# Patient Record
Sex: Male | Born: 1980 | Race: White | Hispanic: No | Marital: Married | Smoking: Never smoker
Health system: Southern US, Community
[De-identification: ages and names within clinical notes are randomized; demographics above are authoritative.]

## PROBLEM LIST (undated history)

## (undated) DIAGNOSIS — T7840XA Allergy, unspecified, initial encounter: Secondary | ICD-10-CM

## (undated) HISTORY — DX: Allergy, unspecified, initial encounter: T78.40XA

## (undated) HISTORY — PX: WISDOM TOOTH EXTRACTION: SHX21

---

## 2015-05-03 ENCOUNTER — Encounter: Payer: Self-pay | Admitting: Family Medicine

## 2015-05-03 ENCOUNTER — Ambulatory Visit (INDEPENDENT_AMBULATORY_CARE_PROVIDER_SITE_OTHER): Payer: BLUE CROSS/BLUE SHIELD | Admitting: Family Medicine

## 2015-05-03 VITALS — BP 118/70 | HR 74 | Temp 98.0°F | Ht 65.0 in | Wt 184.8 lb

## 2015-05-03 DIAGNOSIS — L500 Allergic urticaria: Secondary | ICD-10-CM | POA: Insufficient documentation

## 2015-05-03 DIAGNOSIS — E66811 Obesity, class 1: Secondary | ICD-10-CM | POA: Insufficient documentation

## 2015-05-03 DIAGNOSIS — E669 Obesity, unspecified: Secondary | ICD-10-CM

## 2015-05-03 DIAGNOSIS — Z0000 Encounter for general adult medical examination without abnormal findings: Secondary | ICD-10-CM

## 2015-05-03 LAB — LIPID PANEL
Cholesterol: 133 mg/dL (ref 0–200)
HDL: 33.2 mg/dL — AB (ref >39.00)
LDL CALC: 86 mg/dL (ref 0–99)
NONHDL: 100.1
Total CHOL/HDL Ratio: 4
Triglycerides: 71 mg/dL (ref 0.0–149.0)
VLDL: 14.2 mg/dL (ref 0.0–40.0)

## 2015-05-03 LAB — COMPREHENSIVE METABOLIC PANEL
ALK PHOS: 60 U/L (ref 39–117)
ALT: 27 U/L (ref 0–53)
AST: 17 U/L (ref 0–37)
Albumin: 4.1 g/dL (ref 3.5–5.2)
BILIRUBIN TOTAL: 0.5 mg/dL (ref 0.2–1.2)
BUN: 14 mg/dL (ref 6–23)
CALCIUM: 8.9 mg/dL (ref 8.4–10.5)
CO2: 30 meq/L (ref 19–32)
Chloride: 106 mEq/L (ref 96–112)
Creatinine, Ser: 1.11 mg/dL (ref 0.40–1.50)
GFR: 79.56 mL/min (ref >60.00)
Glucose, Bld: 97 mg/dL (ref 70–99)
POTASSIUM: 4.6 meq/L (ref 3.5–5.1)
Sodium: 142 mEq/L (ref 135–145)
TOTAL PROTEIN: 6.6 g/dL (ref 6.0–8.3)

## 2015-05-03 LAB — TSH: TSH: 1.57 u[IU]/mL (ref 0.35–4.50)

## 2015-05-03 NOTE — Patient Instructions (Addendum)
Bring me copy of your immunization records to review (to see if you've had Tdap). labwork today. Return as needed or in 1-2 years for next physical.  Health Maintenance A healthy lifestyle and preventative care can promote health and wellness.  Maintain regular health, dental, and eye exams.  Eat a healthy diet. Foods like vegetables, fruits, whole grains, low-fat dairy products, and lean protein foods contain the nutrients you need and are low in calories. Decrease your intake of foods high in solid fats, added sugars, and salt. Get information about a proper diet from your health care provider, if necessary.  Regular physical exercise is one of the most important things you can do for your health. Most adults should get at least 150 minutes of moderate-intensity exercise (any activity that increases your heart rate and causes you to sweat) each week. In addition, most adults need muscle-strengthening exercises on 2 or more days a week.   Maintain a healthy weight. The body mass index (BMI) is a screening tool to identify possible weight problems. It provides an estimate of body fat based on height and weight. Your health care provider can find your BMI and can help you achieve or maintain a healthy weight. For males 20 years and older:  A BMI below 18.5 is considered underweight.  A BMI of 18.5 to 24.9 is normal.  A BMI of 25 to 29.9 is considered overweight.  A BMI of 30 and above is considered obese.  Maintain normal blood lipids and cholesterol by exercising and minimizing your intake of saturated fat. Eat a balanced diet with plenty of fruits and vegetables. Blood tests for lipids and cholesterol should begin at age 75 and be repeated every 5 years. If your lipid or cholesterol levels are high, you are over age 83, or you are at high risk for heart disease, you may need your cholesterol levels checked more frequently.Ongoing high lipid and cholesterol levels should be treated with  medicines if diet and exercise are not working.  If you smoke, find out from your health care provider how to quit. If you do not use tobacco, do not start.  Lung cancer screening is recommended for adults aged 55-80 years who are at high risk for developing lung cancer because of a history of smoking. A yearly low-dose CT scan of the lungs is recommended for people who have at least a 30-pack-year history of smoking and are current smokers or have quit within the past 15 years. A pack year of smoking is smoking an average of 1 pack of cigarettes a day for 1 year (for example, a 30-pack-year history of smoking could mean smoking 1 pack a day for 30 years or 2 packs a day for 15 years). Yearly screening should continue until the smoker has stopped smoking for at least 15 years. Yearly screening should be stopped for people who develop a health problem that would prevent them from having lung cancer treatment.  If you choose to drink alcohol, do not have more than 2 drinks per day. One drink is considered to be 12 oz (360 mL) of beer, 5 oz (150 mL) of wine, or 1.5 oz (45 mL) of liquor.  Avoid the use of street drugs. Do not share needles with anyone. Ask for help if you need support or instructions about stopping the use of drugs.  High blood pressure causes heart disease and increases the risk of stroke. Blood pressure should be checked at least every 1-2 years. Ongoing high  blood pressure should be treated with medicines if weight loss and exercise are not effective.  If you are 51-72 years old, ask your health care provider if you should take aspirin to prevent heart disease.  Diabetes screening involves taking a blood sample to check your fasting blood sugar level. This should be done once every 3 years after age 26 if you are at a normal weight and without risk factors for diabetes. Testing should be considered at a younger age or be carried out more frequently if you are overweight and have at  least 1 risk factor for diabetes.  Colorectal cancer can be detected and often prevented. Most routine colorectal cancer screening begins at the age of 24 and continues through age 55. However, your health care provider may recommend screening at an earlier age if you have risk factors for colon cancer. On a yearly basis, your health care provider may provide home test kits to check for hidden blood in the stool. A small camera at the end of a tube may be used to directly examine the colon (sigmoidoscopy or colonoscopy) to detect the earliest forms of colorectal cancer. Talk to your health care provider about this at age 37 when routine screening begins. A direct exam of the colon should be repeated every 5-10 years through age 90, unless early forms of precancerous polyps or small growths are found.  People who are at an increased risk for hepatitis B should be screened for this virus. You are considered at high risk for hepatitis B if:  You were born in a country where hepatitis B occurs often. Talk with your health care provider about which countries are considered high risk.  Your parents were born in a high-risk country and you have not received a shot to protect against hepatitis B (hepatitis B vaccine).  You have HIV or AIDS.  You use needles to inject street drugs.  You live with, or have sex with, someone who has hepatitis B.  You are a man who has sex with other men (MSM).  You get hemodialysis treatment.  You take certain medicines for conditions like cancer, organ transplantation, and autoimmune conditions.  Hepatitis C blood testing is recommended for all people born from [birth year] through 1967 and any individual with known risk factors for hepatitis C.  Healthy men should no longer receive prostate-specific antigen (PSA) blood tests as part of routine cancer screening. Talk to your health care provider about prostate cancer screening.  Testicular cancer screening is not recommended  for adolescents or adult males who have no symptoms. Screening includes self-exam, a health care provider exam, and other screening tests. Consult with your health care provider about any symptoms you have or any concerns you have about testicular cancer.  Practice safe sex. Use condoms and avoid high-risk sexual practices to reduce the spread of sexually transmitted infections (STIs).  You should be screened for STIs, including gonorrhea and chlamydia if:  You are sexually active and are younger than 24 years.  You are older than 24 years, and your health care provider tells you that you are at risk for this type of infection.  Your sexual activity has changed since you were last screened, and you are at an increased risk for chlamydia or gonorrhea. Ask your health care provider if you are at risk.  If you are at risk of being infected with HIV, it is recommended that you take a prescription medicine daily to prevent HIV infection. This is called  pre-exposure prophylaxis (PrEP). You are considered at risk if:  You are a man who has sex with other men (MSM).  You are a heterosexual man who is sexually active with multiple partners.  You take drugs by injection.  You are sexually active with a partner who has HIV.  Talk with your health care provider about whether you are at high risk of being infected with HIV. If you choose to begin PrEP, you should first be tested for HIV. You should then be tested every 3 months for as long as you are taking PrEP.  Use sunscreen. Apply sunscreen liberally and repeatedly throughout the day. You should seek shade when your shadow is shorter than you. Protect yourself by wearing long sleeves, pants, a wide-brimmed hat, and sunglasses year round whenever you are outdoors.  Tell your health care provider of new moles or changes in moles, especially if there is a change in shape or color. Also, tell your health care provider if a mole is larger than the size  of a pencil eraser.  A one-time screening for abdominal aortic aneurysm (AAA) and surgical repair of large AAAs by ultrasound is recommended for men aged 60-75 years who are current or former smokers.  Stay current with your vaccines (immunizations). Document Released: 02/13/2008 Document Revised: 08/22/2013 Document Reviewed: 01/12/2011 [facility] Patient Information 2015 [place], [place]. This information is not intended to replace advice given to you by your health care provider. Make sure you discuss any questions you have with your health care provider.

## 2015-05-03 NOTE — Progress Notes (Signed)
Pre visit review using our clinic review tool, if applicable. No additional management support is needed unless otherwise documented below in the visit note. 

## 2015-05-03 NOTE — Assessment & Plan Note (Signed)
Encouraged incorporating regular exercise regimen in routine. Body mass index is 30.74 kg/(m^2).

## 2015-05-03 NOTE — Progress Notes (Signed)
BP 118/70 mmHg  Pulse 74  Temp(Src) 98 F (36.7 C) (Oral)  Ht 5' 5"  (1.651 m)  Wt 184 lb 12 oz (83.802 kg)  BMI 30.74 kg/m2  SpO2 96%   CC: new pt to establish care  Subjective:    Patient ID: Martin Bryant, male    DOB: [birth year], 34 y.o.   MRN: [id number]  HPI: Martin Bryant is a 34 y.o. male presenting on 05/03/2015 for Establish Care and Pruritis   Allergic rash described as urticarial/hive rash that seems to develop around exposure to something at work. Sterile gowning at work, works with varicella and MMR vaccine. Thinks has latex free protective equipment. Zyrtec takes care of this. Less reactions out of work. Works third shift. Rash seems to develop on drive home. Has not found specific foods to trigger this. Doesn't think temperature related.  No fevers, throat closing, tongue or lip swelling, nausea, dyspnea.   Preventative: Not fasting today Flu shot - yearly at work Td - declines today Seat belt use discussed Sunscreen use discussed, no changing moles on skin.  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 Interim medical history since our last visit reviewed. Allergies and medications reviewed and updated. No current outpatient prescriptions on file prior to visit.   No current facility-administered medications on file prior to visit.    Review of Systems  Constitutional: Negative for fever, chills, activity change, appetite change, fatigue and unexpected weight change.  HENT: Negative for hearing loss.   Eyes: Negative for visual disturbance.  Respiratory: Negative for cough, chest tightness, shortness of breath and wheezing.   Cardiovascular: Negative for  chest pain, palpitations and leg swelling.  Gastrointestinal: Negative for nausea, vomiting, abdominal pain, diarrhea, constipation, blood in stool and abdominal distention.  Genitourinary: Negative for hematuria and difficulty urinating.  Musculoskeletal: Negative for myalgias, arthralgias and neck pain.  Skin: Negative for rash.  Neurological: Negative for dizziness, seizures, syncope and headaches.  Hematological: Negative for adenopathy. Does not bruise/bleed easily.  Psychiatric/Behavioral: Negative for dysphoric mood. The patient is not nervous/anxious.    Per HPI unless specifically indicated above     Objective:    BP 118/70 mmHg  Pulse 74  Temp(Src) 98 F (36.7 C) (Oral)  Ht 5' 5"  (1.651 m)  Wt 184 lb 12 oz (83.802 kg)  BMI 30.74 kg/m2  SpO2 96%  Wt Readings from Last 3 Encounters:  05/03/15 184 lb 12 oz (83.802 kg)    Physical Exam  Constitutional: He is oriented to person, place, and time. He appears well-developed and well-nourished. No distress.  HENT:  Head: Normocephalic and atraumatic.  Right Ear: Hearing, tympanic membrane, external ear and ear canal normal.  Left Ear: Hearing, tympanic membrane, external ear and ear canal normal.  Nose: Nose normal.  Mouth/Throat: Uvula is midline, oropharynx is clear and moist and mucous membranes are normal. No oropharyngeal exudate, posterior oropharyngeal edema or posterior oropharyngeal erythema.  Eyes: Conjunctivae and EOM are normal. Pupils are equal, round, and reactive to light. No scleral icterus.  Neck: Normal range of motion. Neck supple. No thyromegaly present.  Cardiovascular: Normal rate, regular rhythm, normal heart sounds and intact distal pulses.   No murmur heard. Pulses:  Radial pulses are 2+ on the right side, and 2+ on the left side.  Pulmonary/Chest: Effort normal and breath sounds normal. No respiratory distress. He has no wheezes. He has no rales.  Abdominal: Soft. Bowel sounds are normal. He  exhibits no distension and no mass. There is no tenderness. There is no rebound and no guarding.  Musculoskeletal: Normal range of motion. He exhibits no edema.  Lymphadenopathy:    He has no cervical adenopathy.  Neurological: He is alert and oriented to person, place, and time.  CN grossly intact, station and gait intact  Skin: Skin is warm and dry. No rash noted.  Psychiatric: He has a normal mood and affect. His behavior is normal. Judgment and thought content normal.  Nursing note and vitals reviewed.  No results found for this or any previous visit.    Assessment & Plan:   Problem List Items Addressed This Visit    Obesity, Class I, BMI 30-34.9    Encouraged incorporating regular exercise regimen in routine. Body mass index is 30.74 kg/(m^2).       Relevant Orders   Lipid panel   TSH   Comprehensive metabolic panel   Health maintenance examination - Primary    Preventative protocols reviewed and updated unless pt declined. Discussed healthy diet and lifestyle.       Allergic urticaria    Have not found trigger for this. No systemic sxs - limited to skin manifestations. Reassured. Continue to monitor, work on finding trigger. If worsening, consider allergy referral. Continue zyrtec PRN.  Discussed different types of urticarial reactions (med, food allergies, temperature or stress urticaria, etc)          Follow up plan: Return in about 1 year (around 05/02/2016), or as needed, for annual exam, prior fasting for blood work.

## 2015-05-03 NOTE — Assessment & Plan Note (Addendum)
Have not found trigger for this. No systemic sxs - limited to skin manifestations. Reassured. Continue to monitor, work on finding trigger. If worsening, consider allergy referral. Continue zyrtec PRN.  Discussed different types of urticarial reactions (med, food allergies, temperature or stress urticaria, etc)

## 2015-05-03 NOTE — Assessment & Plan Note (Signed)
Preventative protocols reviewed and updated unless pt declined. Discussed healthy diet and lifestyle.  

## 2015-05-07 ENCOUNTER — Encounter: Payer: Self-pay | Admitting: *Deleted

## 2015-06-24 ENCOUNTER — Other Ambulatory Visit: Payer: Self-pay | Admitting: Internal Medicine

## 2015-06-24 MED ORDER — TRIAMCINOLONE ACETONIDE 0.5 % EX OINT: 1.0000 "application " | TOPICAL_OINTMENT | Freq: Two times a day (BID) | CUTANEOUS | Status: AC

## 2015-12-12 ENCOUNTER — Encounter: Payer: Self-pay | Admitting: Emergency Medicine

## 2015-12-12 DIAGNOSIS — R55 Syncope and collapse: Secondary | ICD-10-CM | POA: Insufficient documentation

## 2015-12-12 DIAGNOSIS — R1011 Right upper quadrant pain: Secondary | ICD-10-CM | POA: Diagnosis not present

## 2015-12-12 LAB — CBC
HCT: 44.8 % (ref 40.0–52.0)
Hemoglobin: 15.6 g/dL (ref 13.0–18.0)
MCH: 31.9 pg (ref 26.0–34.0)
MCHC: 34.9 g/dL (ref 32.0–36.0)
MCV: 91.5 fL (ref 80.0–100.0)
PLATELETS: 236 10*3/uL (ref 150–440)
RBC: 4.9 MIL/uL (ref 4.40–5.90)
RDW: 12.6 % (ref 11.5–14.5)
WBC: 9.4 10*3/uL (ref 3.8–10.6)

## 2015-12-12 LAB — COMPREHENSIVE METABOLIC PANEL
ALBUMIN: 4.1 g/dL (ref 3.5–5.0)
ALK PHOS: 55 U/L (ref 38–126)
ALT: 24 U/L (ref 17–63)
ANION GAP: 5 (ref 5–15)
AST: 22 U/L (ref 15–41)
BILIRUBIN TOTAL: 0.9 mg/dL (ref 0.3–1.2)
BUN: 23 mg/dL — AB (ref 6–20)
CALCIUM: 8.6 mg/dL — AB (ref 8.9–10.3)
CO2: 24 mmol/L (ref 22–32)
CREATININE: 0.99 mg/dL (ref 0.61–1.24)
Chloride: 106 mmol/L (ref 101–111)
GFR calc Af Amer: >60 mL/min (ref >60)
GFR calc non Af Amer: >60 mL/min (ref >60)
GLUCOSE: 138 mg/dL — AB (ref 65–99)
Potassium: 4 mmol/L (ref 3.5–5.1)
Sodium: 135 mmol/L (ref 135–145)
TOTAL PROTEIN: 6.8 g/dL (ref 6.5–8.1)

## 2015-12-12 LAB — LIPASE, BLOOD: Lipase: 20 U/L (ref 11–51)

## 2015-12-12 LAB — TROPONIN I

## 2015-12-12 NOTE — ED Notes (Signed)
Pt arrived to triage by EMS with c/o right sided upper abdominal quadrant pain. Pt states pain is mostly under right ribs, has shortness of breath and had a syncopal episode at home. Pt denies pain since syncopal episode.

## 2015-12-13 ENCOUNTER — Emergency Department

## 2015-12-13 ENCOUNTER — Emergency Department
Admission: EM | Admit: 2015-12-13 | Discharge: 2015-12-13 | Disposition: A | Discharge status: Home / Self Care | Attending: Emergency Medicine | Admitting: Emergency Medicine

## 2015-12-13 DIAGNOSIS — R1011 Right upper quadrant pain: Secondary | ICD-10-CM

## 2015-12-13 DIAGNOSIS — R55 Syncope and collapse: Secondary | ICD-10-CM

## 2015-12-13 LAB — URINALYSIS COMPLETE WITH MICROSCOPIC (ARMC ONLY)
BILIRUBIN URINE: NEGATIVE
Glucose, UA: NEGATIVE mg/dL
Hgb urine dipstick: NEGATIVE
Ketones, ur: NEGATIVE mg/dL
Leukocytes, UA: NEGATIVE
Nitrite: NEGATIVE
PROTEIN: NEGATIVE mg/dL
Specific Gravity, Urine: 1.024 (ref 1.005–1.030)
pH: 5 (ref 5.0–8.0)

## 2015-12-13 LAB — FIBRIN DERIVATIVES D-DIMER (ARMC ONLY): FIBRIN DERIVATIVES D-DIMER (ARMC): 109 (ref 0–499)

## 2015-12-13 MED ORDER — IOPAMIDOL (ISOVUE-300) INJECTION 61%
100.0000 mL | Freq: Once | INTRAVENOUS | Status: AC | PRN
  Administered 2015-12-13: 100 mL via INTRAVENOUS

## 2015-12-13 MED ORDER — DIATRIZOATE MEGLUMINE & SODIUM 66-10 % PO SOLN
15.0000 mL | Freq: Once | ORAL | Status: AC
  Administered 2015-12-13: 15 mL via ORAL
  Filled 2015-12-13: qty 30

## 2015-12-13 NOTE — ED Notes (Signed)
Brown, MD at bedside. 

## 2015-12-13 NOTE — Discharge Instructions (Signed)
Syncope °Syncope is a medical term for fainting or passing out. This means you lose consciousness and drop to the ground. People are generally unconscious for less than 5 minutes. You may have some muscle twitches for up to 15 seconds before waking up and returning to normal. Syncope occurs more often in older adults, but it can happen to anyone. While most causes of syncope are not dangerous, syncope can be a sign of a serious medical problem. It is important to seek medical care.  °CAUSES  °Syncope is caused by a sudden drop in blood flow to the brain. The specific cause is often not determined. Factors that can bring on syncope include: °· Taking medicines that lower blood pressure. °· Sudden changes in posture, such as standing up quickly. °· Taking more medicine than prescribed. °· Standing in one place for too long. °· Seizure disorders. °· Dehydration and excessive exposure to heat. °· Low blood sugar (hypoglycemia). °· Straining to have a bowel movement. °· Heart disease, irregular heartbeat, or other circulatory problems. °· Fear, emotional distress, seeing blood, or severe pain. °SYMPTOMS  °Right before fainting, you may: °· Feel dizzy or light-headed. °· Feel nauseous. °· See all white or all black in your field of vision. °· Have cold, clammy skin. °DIAGNOSIS  °Your health care provider will ask about your symptoms, perform a physical exam, and perform an electrocardiogram (ECG) to record the electrical activity of your heart. Your health care provider may also perform other heart or blood tests to determine the cause of your syncope which may include: °· Transthoracic echocardiogram (TTE). During echocardiography, sound waves are used to evaluate how blood flows through your heart. °· Transesophageal echocardiogram (TEE). °· Cardiac monitoring. This allows your health care provider to monitor your heart rate and rhythm in real time. °· Holter monitor. This is a portable device that records your  heartbeat and can help diagnose heart arrhythmias. It allows your health care provider to track your heart activity for several days, if needed. °· Stress tests by exercise or by giving medicine that makes the heart beat faster. °TREATMENT  °In most cases, no treatment is needed. Depending on the cause of your syncope, your health care provider may recommend changing or stopping some of your medicines. °HOME CARE INSTRUCTIONS °· Have someone stay with you until you feel stable. °· Do not drive, use machinery, or play sports until your health care provider says it is okay. °· Keep all follow-up appointments as directed by your health care provider. °· Lie down right away if you start feeling like you might faint. Breathe deeply and steadily. Wait until all the symptoms have passed. °· Drink enough fluids to keep your urine clear or pale yellow. °· If you are taking blood pressure or heart medicine, get up slowly and take several minutes to sit and then stand. This can reduce dizziness. °SEEK IMMEDIATE MEDICAL CARE IF:  °· You have a severe headache. °· You have unusual pain in the chest, abdomen, or back. °· You are bleeding from your mouth or rectum, or you have black or tarry stool. °· You have an irregular or very fast heartbeat. °· You have pain with breathing. °· You have repeated fainting or seizure-like jerking during an episode. °· You faint when sitting or lying down. °· You have confusion. °· You have trouble walking. °· You have severe weakness. °· You have vision problems. °If you fainted, call your local emergency services (911 in [place] Do not drive   yourself to the hospital.  °  °This information is not intended to replace advice given to you by your health care provider. Make sure you discuss any questions you have with your health care provider. °  °Document Released: 08/17/2005 Document Revised: 01/01/2015 Document Reviewed: 10/16/2011 °Elsevier Interactive Patient Education ©2016 [employer] ° °Abdominal Pain, Adult °Many things can cause abdominal pain. Usually, abdominal pain is not caused by a disease and will improve without treatment. It can often be observed and treated at home. Your health care provider will do a physical exam and possibly order blood tests and X-rays to help determine the seriousness of your pain. However, in many cases, more time must pass before a clear cause of the pain can be found. Before that point, your health care provider may not know if you need more testing or further treatment. °HOME CARE INSTRUCTIONS °Monitor your abdominal pain for any changes. The following actions may help to alleviate any discomfort you are experiencing: °· Only take over-the-counter or prescription medicines as directed by your health care provider. °· Do not take laxatives unless directed to do so by your health care provider. °· Try a clear liquid diet (broth, tea, or water) as directed by your health care provider. Slowly move to a bland diet as tolerated. °SEEK MEDICAL CARE IF: °· You have unexplained abdominal pain. °· You have abdominal pain associated with nausea or diarrhea. °· You have pain when you urinate or have a bowel movement. °· You experience abdominal pain that wakes you in the night. °· You have abdominal pain that is worsened or improved by eating food. °· You have abdominal pain that is worsened with eating fatty foods. °· You have a fever. °SEEK IMMEDIATE MEDICAL CARE IF: °· Your pain does not go away within 2 hours. °· You keep throwing up (vomiting). °· Your pain is felt only in portions of the abdomen, such as the right side or the left lower portion of the abdomen. °· You pass bloody or black tarry stools. °MAKE SURE YOU: °· Understand these instructions. °· Will watch your condition. °· Will get help right away if you are not doing well or get worse. °  °This information is not intended to replace advice given to you by your health care provider. Make sure you  discuss any questions you have with your health care provider. °  °Document Released: 05/27/2005 Document Revised: 05/08/2015 Document Reviewed: 04/26/2013 °Elsevier Interactive Patient Education ©2016 [employer] ° °

## 2015-12-13 NOTE — ED Provider Notes (Signed)
[facility] Emergency Department Provider Note  ____________________________________________  Time seen: 1:10 AM  I have reviewed the triage vital signs and the nursing notes.   HISTORY  Chief Complaint Abdominal Pain     HPI Ramiel Forti is a 35 y.o. male with no past medical history presents with acute onset of right upper quadrant pain tonight accompanied by shortness of breath and witnessed syncopal episode or urinary incontinence. Patient denies any pain at this time stated that when he regained consciousness after the syncopal episode the pain was gone. Patient admits that the pain has been intermittent over the past 3 months. Patient denies any vomiting or diarrhea. Patient admits to previous syncopal episode during IV placement in the past     Past Medical History  Diagnosis Date  . Allergy     Patient Active Problem List   Diagnosis Date Noted  . Obesity, Class I, BMI 30-34.9 05/03/2015  . Health maintenance examination 05/03/2015  . Allergic urticaria 05/03/2015    Past Surgical History  Procedure Laterality Date  . Wisdom tooth extraction      Current Outpatient Rx  Name  Route  Sig  Dispense  Refill  . cetirizine (ZYRTEC ALLERGY) 10 MG tablet   Oral   Take 10 mg by mouth daily.         . Multiple Vitamins-Minerals (MULTIVITAMIN ADULT PO)   Oral   Take 1 tablet by mouth daily.         Marland Kitchen triamcinolone ointment (KENALOG) 0.5 %   Topical   Apply 1 application topically 2 (two) times daily.   30 g   0     Allergies Sulfa antibiotics  Family History  Problem Relation Age of Onset  . Diabetes Mother     prediabetic  . CAD Maternal Grandfather     MI  . Diabetes Paternal Grandmother   . Stroke Neg Hx   . Hypertension Neg Hx   . Cancer Neg Hx     Social History Social History  Substance Use Topics  . Smoking status: Never Smoker   . Smokeless tobacco: Never Used  . Alcohol Use: 0.0 oz/week    0 Standard drinks  or equivalent per week     Comment: occasional    Review of Systems  Constitutional: Negative for fever. Eyes: Negative for visual changes. ENT: Negative for sore throat. Cardiovascular: Negative for chest pain. Respiratory: Negative for shortness of breath. Gastrointestinal: Negative for abdominal pain, vomiting and diarrhea. Genitourinary: Negative for dysuria. Musculoskeletal: Negative for back pain. Skin: Negative for rash. Neurological: Negative for headaches, focal weakness or numbness.   10-point ROS otherwise negative.  ____________________________________________   PHYSICAL EXAM:  VITAL SIGNS: ED Triage Vitals  Enc Vitals Group     BP 12/12/15 2159 137/88 mmHg     Pulse Rate 12/12/15 2159 98     Resp 12/12/15 2159 15     Temp 12/12/15 2159 98.7 F (37.1 C)     Temp Source 12/12/15 2159 Oral     SpO2 12/12/15 2159 99 %     Weight 12/12/15 2159 170 lb (77.111 kg)     Height 12/12/15 2159  (1.676 m)     Head Cir --      Peak Flow --      Pain Score 12/13/15 0042 0     Pain Loc --      Pain Edu? --      Excl. in GC? --  Constitutional: Alert and oriented. Well appearing and in no distress. Eyes: Conjunctivae are normal. PERRL. Normal extraocular movements. ENT   Head: Normocephalic and atraumatic.   Nose: No congestion/rhinnorhea.   Mouth/Throat: Mucous membranes are moist.   Neck: No stridor. Hematological/Lymphatic/Immunilogical: No cervical lymphadenopathy. Cardiovascular: Normal rate, regular rhythm. Normal and symmetric distal pulses are present in all extremities. No murmurs, rubs, or gallops. Respiratory: Normal respiratory effort without tachypnea nor retractions. Breath sounds are clear and equal bilaterally. No wheezes/rales/rhonchi. Gastrointestinal: Soft and nontender. No distention. There is no CVA tenderness. Genitourinary: deferred Musculoskeletal: Nontender with normal range of motion in all extremities. No joint  effusions.  No lower extremity tenderness nor edema. Neurologic:  Normal speech and language. No gross focal neurologic deficits are appreciated. Speech is normal.  Skin:  Skin is warm, dry and intact. No rash noted. Psychiatric: Mood and affect are normal. Speech and behavior are normal. Patient exhibits appropriate insight and judgment.  ____________________________________________    LABS (pertinent positives/negatives)  Labs Reviewed  COMPREHENSIVE METABOLIC PANEL - Abnormal; Notable for the following:    Glucose, Bld 138 (*)    BUN 23 (*)    Calcium 8.6 (*)    All other components within normal limits  URINALYSIS COMPLETEWITH MICROSCOPIC ([facility] ONLY) - Abnormal; Notable for the following:    Color, Urine YELLOW (*)    APPearance CLEAR (*)    Bacteria, UA RARE (*)    Squamous Epithelial / LPF 0-5 (*)    All other components within normal limits  LIPASE, BLOOD  CBC  TROPONIN I  FIBRIN DERIVATIVES D-DIMER ([facility] ONLY)     ____________________________________________   EKG  ED ECG REPORT I, Axis N Mairany Bruno, the attending physician, personally viewed and interpreted this ECG.   Date: 12/13/2015  EKG Time: 10:09 PM  Rate: 85  Rhythm: Normal sinus rhythm  Axis: Normal  Intervals: Normal  ST&T Change: None   ____________________________________________    RADIOLOGY   CT Abdomen Pelvis W Contrast (Final result) Result time: 12/13/15 03:57:03   Final result by Rad Results In Interface (12/13/15 03:57:03)   Narrative:   CLINICAL DATA: Acute onset of right upper quadrant abdominal pain and shortness of breath. Syncope. Initial encounter.  EXAM: CT ABDOMEN AND PELVIS WITH CONTRAST  TECHNIQUE: Multidetector CT imaging of the abdomen and pelvis was performed using the standard protocol following bolus administration of intravenous contrast.  CONTRAST: 100mL ISOVUE-300 IOPAMIDOL (ISOVUE-300) INJECTION 61%  COMPARISON: None.  FINDINGS: The visualized  lung bases are clear.  The liver and spleen are unremarkable in appearance. The gallbladder is within normal limits. The pancreas and adrenal glands are unremarkable.  The kidneys are unremarkable in appearance. There is no evidence of hydronephrosis. No renal or ureteral stones are seen. No perinephric stranding is appreciated.  No free fluid is identified. The small bowel is unremarkable in appearance. The stomach is within normal limits. No acute vascular abnormalities are seen.  The appendix is normal in caliber, without evidence of appendicitis. The colon is unremarkable in appearance.  The bladder is mildly distended and grossly unremarkable. The prostate remains normal in size, with minimal calcification. No inguinal lymphadenopathy is seen.  No acute osseous abnormalities are identified.  IMPRESSION: Unremarkable contrast-enhanced CT of the abdomen and pelvis.   Electronically Signed By: Roanna RaiderJeffery Chang M.D. On: 12/13/2015 03:57          CT Head Wo Contrast (Final result) Result time: 12/13/15 03:27:19   Final result by Rad Results In Interface (12/13/15 03:27:19)  Narrative:   CLINICAL DATA: Acute onset of syncope. Lightheadedness. Initial encounter.  EXAM: CT HEAD WITHOUT CONTRAST  TECHNIQUE: Contiguous axial images were obtained from the base of the skull through the vertex without intravenous contrast.  COMPARISON: None.  FINDINGS: There is no evidence of acute infarction, mass lesion, or intra- or extra-axial hemorrhage on CT.  The posterior fossa, including the cerebellum, brainstem and fourth ventricle, is within normal limits. The third and lateral ventricles, and basal ganglia are unremarkable in appearance. The cerebral hemispheres are symmetric in appearance, with normal gray-white differentiation. No mass effect or midline shift is seen.  There is no evidence of fracture; visualized osseous structures are unremarkable in  appearance. The visualized portions of the orbits are within normal limits. The paranasal sinuses and mastoid air cells are well-aerated. No significant soft tissue abnormalities are seen.  IMPRESSION: Unremarkable noncontrast CT of the head.   Electronically Signed By: Roanna Raider M.D. On: 12/13/2015 03:27         INITIAL IMPRESSION / ASSESSMENT AND PLAN / ED COURSE  Pertinent labs & imaging results that were available during my care of the patient were reviewed by me and considered in my medical decision making (see chart for details).  Patient reassessed remains without complaint. No pain at this time no clear etiology at L5 for the patient's syncopal episode will refer the patient to Dr. Sharen Hones primary care provider for further outpatient evaluation and management  ____________________________________________   FINAL CLINICAL IMPRESSION(S) / ED DIAGNOSES  Final diagnoses:  Syncope, unspecified syncope type  Right upper quadrant pain      Darci Current, MD 12/13/15 0451

## 2015-12-16 ENCOUNTER — Telehealth: Payer: Self-pay | Admitting: *Deleted

## 2015-12-16 NOTE — Telephone Encounter (Signed)
Can we offer f/u appt this week with me? Ok to place in 30 min slot.

## 2015-12-16 NOTE — Telephone Encounter (Signed)
Patient was seen [facility] ED on 4/13 for RUQ pain.  Abdominal CT was negative.  Follow up is scheduled here for 4/27.  ED recommends GI work up as soon as is available.  Patient's wife contacted their office for appointment, but they require referral from PCP prior to scheduling.  Okay to put in referral to GI?  Please advise.

## 2015-12-16 NOTE — Telephone Encounter (Signed)
Appointment rescheduled to 4/19 at 1030.  Wife is aware.

## 2015-12-18 ENCOUNTER — Ambulatory Visit (INDEPENDENT_AMBULATORY_CARE_PROVIDER_SITE_OTHER): Payer: BLUE CROSS/BLUE SHIELD | Admitting: Family Medicine

## 2015-12-18 ENCOUNTER — Encounter: Payer: Self-pay | Admitting: Family Medicine

## 2015-12-18 VITALS — BP 104/82 | HR 70 | Temp 98.0°F | Ht 66.0 in | Wt 188.4 lb

## 2015-12-18 DIAGNOSIS — R1011 Right upper quadrant pain: Secondary | ICD-10-CM

## 2015-12-18 NOTE — Patient Instructions (Addendum)
Let's check RUQ ultrasound. If normal we will proceed with HIDA scan to check gallbladder function.  If all normal, we may refer you to GI.  Let us know if any further passing out episodes.

## 2015-12-18 NOTE — Progress Notes (Signed)
BP 104/82 mmHg  Pulse 70  Temp(Src) 98 F (36.7 C) (Oral)  Ht 5\' 6"  (1.676 m)  Wt 188 lb 6.4 oz (85.458 kg)  BMI 30.42 kg/m2  SpO2 97%   CC: ER f/u visit  Subjective:    Patient ID: Martin Bryant, male    DOB: 01/25/1981, 35 y.o.   MRN: [id number]  HPI: Martin Bryant is a 35 y.o. male presenting on 12/18/2015 for Hospitalization Follow-up   Presents with wife Kenney Housemananya who [place] [employer] [employer].  Endorses intermittent RUQ over last 3 months. Review[facility] after acute episode of RUQ pain followed by dyspnea and syncope and urinary incontinence x1. Describes episode of intense sharp stabbing RUQ pain, latest time lasted several minutes. No associated nausea or vomiting, bowel changes, no tongue biting or shaking. Denies GERD sxs, dysphagia, early satiety. Latest episode did happen after he drank [employer] venti vanilla sweet cream latte.   EKG was normal, labs unrevealing including CBC, lipase, CMP, UA, and Ddimer. CT head and contrasted abd/pelvis was also unremarkable.   rec GI referral - presents today as f/u.   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   Decreasing caffeine.   Relevant past medical, surgical, family and social history reviewed and updated as indicated. Interim medical history since our last visit reviewed. Allergies and medications reviewed and updated. Current Outpatient Prescriptions on File Prior to Visit  Medication Sig  . cetirizine (ZYRTEC ALLERGY) 10 MG tablet Take 10 mg by mouth daily.  . Multiple Vitamins-Minerals (MULTIVITAMIN ADULT PO) Take 1 tablet by mouth daily.  Marland Kitchen. triamcinolone ointment (KENALOG) 0.5 % Apply 1 application topically 2 (two) times daily.   No current facility-administered medications on file prior to visit.    Review of Systems Per HPI unless specifically indicated in ROS section     Objective:    BP 104/82 mmHg  Pulse 70  Temp(Src) 98 F (36.7 C) (Oral)  Ht 5\' 6"  (1.676 m)  Wt 188  lb 6.4 oz (85.458 kg)  BMI 30.42 kg/m2  SpO2 97%  Wt Readings from Last 3 Encounters:  12/18/15 188 lb 6.4 oz (85.458 kg)  12/12/15 170 lb (77.111 kg)  05/03/15 184 lb 12 oz (83.802 kg)    Physical Exam  Constitutional: He appears well-developed and well-nourished. No distress.  HENT:  Mouth/Throat: Oropharynx is clear and moist. No oropharyngeal exudate.  Eyes: Conjunctivae and EOM are normal. Pupils are equal, round, and reactive to light.  Neck: Normal range of motion. Neck supple. Carotid bruit is not present. No thyromegaly present.  Cardiovascular: Normal rate, regular rhythm, normal heart sounds and intact distal pulses.   No murmur heard. Pulmonary/Chest: Effort normal and breath sounds normal. No respiratory distress. He has no wheezes. He has no rales.  Abdominal: Soft. Bowel sounds are normal. He exhibits no distension and no mass. There is no tenderness. There is no rebound and no guarding.  Lymphadenopathy:    He has no cervical adenopathy.  Skin: Skin is warm and dry. No rash noted.  Psychiatric: He has a normal mood and affect.  Nursing note and vitals reviewed.  Results for orders placed or performed during the hospital encounter of 12/13/15  Lipase, blood  Result Value Ref Range   Lipase 20 11 - 51 U/L  Comprehensive metabolic panel  Result Value Ref Range   Sodium 135 135 - 145 mmol/L   Potassium 4.0 3.5 - 5.1 mmol/L   Chloride  106 101 - 111 mmol/L   CO2 24 22 - 32 mmol/L   Glucose, Bld 138 (H) 65 - 99 mg/dL   BUN 23 (H) 6 - 20 mg/dL   Creatinine, Ser 1.61 0.61 - 1.24 mg/dL   Calcium 8.6 (L) 8.9 - 10.3 mg/dL   Total Protein 6.8 6.5 - 8.1 g/dL   Albumin 4.1 3.5 - 5.0 g/dL   AST 22 15 - 41 U/L   ALT 24 17 - 63 U/L   Alkaline Phosphatase 55 38 - 126 U/L   Total Bilirubin 0.9 0.3 - 1.2 mg/dL   GFR calc non Af Amer >60 >60 mL/min   GFR calc Af Amer >60 >60 mL/min   Anion gap 5 5 - 15  CBC  Result Value Ref Range   WBC 9.4 3.8 - 10.6 K/uL   RBC 4.90 4.40  - 5.90 MIL/uL   Hemoglobin 15.6 13.0 - 18.0 g/dL   HCT 09.6 04.5 - 40.9 %   MCV 91.5 80.0 - 100.0 fL   MCH 31.9 26.0 - 34.0 pg   MCHC 34.9 32.0 - 36.0 g/dL   RDW 81.1 91.4 - 78.2 %   Platelets 236 150 - 440 K/uL  Urinalysis complete, with microscopic ([facility] only)  Result Value Ref Range   Color, Urine YELLOW (A) YELLOW   APPearance CLEAR (A) CLEAR   Glucose, UA NEGATIVE NEGATIVE mg/dL   Bilirubin Urine NEGATIVE NEGATIVE   Ketones, ur NEGATIVE NEGATIVE mg/dL   Specific Gravity, Urine 1.024 1.005 - 1.030   Hgb urine dipstick NEGATIVE NEGATIVE   pH 5.0 5.0 - 8.0   Protein, ur NEGATIVE NEGATIVE mg/dL   Nitrite NEGATIVE NEGATIVE   Leukocytes, UA NEGATIVE NEGATIVE   RBC / HPF 0-5 0 - 5 RBC/hpf   WBC, UA 0-5 0 - 5 WBC/hpf   Bacteria, UA RARE (A) NONE SEEN   Squamous Epithelial / LPF 0-5 (A) NONE SEEN   Mucous PRESENT   Troponin I  Result Value Ref Range   Troponin I <0.03 <0.031 ng/mL  Fibrin derivatives D-Dimer  Result Value Ref Range   Fibrin derivatives D-dimer ([facility]) 109 0 - 499      Assessment & Plan:   Problem List Items Addressed This Visit    RUQ abdominal pain - Primary    Anticipate gallbladder dysfunction, discussed etiology.  Recent CT without gallstones or fatty liver. Will order RUQ abd [place], if normal will proceed with HIDA scan. If normal, refer to GI.  Discussed avoiding fatty greasy foods for now. Pt/wife agree with plan.      Relevant Orders   US Abdomen Limited RUQ       Follow up plan: Return if symptoms worsen or fail to improve.  Eustaquio Boyden, MD

## 2015-12-18 NOTE — Assessment & Plan Note (Signed)
Anticipate gallbladder dysfunction, discussed etiology.  Recent CT without gallstones or fatty liver. Will order RUQ abd US, if normal will proceed with HIDA scan. If normal, refer to GI.  Discussed avoiding fatty greasy foods for now. Pt/wife agree with plan.

## 2015-12-18 NOTE — Progress Notes (Signed)
Pre visit review using our clinic review tool, if applicable. No additional management support is needed unless otherwise documented below in the visit note. 

## 2015-12-19 ENCOUNTER — Ambulatory Visit
Admission: RE | Admit: 2015-12-19 | Discharge: 2015-12-19 | Disposition: A | Discharge status: Home / Self Care | Source: Ambulatory Visit | Attending: Family Medicine | Admitting: Family Medicine

## 2015-12-19 DIAGNOSIS — R1011 Right upper quadrant pain: Secondary | ICD-10-CM | POA: Insufficient documentation

## 2015-12-20 ENCOUNTER — Other Ambulatory Visit: Payer: Self-pay | Admitting: Family Medicine

## 2015-12-20 DIAGNOSIS — R1011 Right upper quadrant pain: Secondary | ICD-10-CM

## 2015-12-25 ENCOUNTER — Ambulatory Visit: Payer: BLUE CROSS/BLUE SHIELD

## 2015-12-25 ENCOUNTER — Ambulatory Visit

## 2015-12-25 ENCOUNTER — Ambulatory Visit
Admission: RE | Admit: 2015-12-25 | Discharge: 2015-12-25 | Disposition: A | Discharge status: Home / Self Care | Source: Ambulatory Visit | Attending: Family Medicine | Admitting: Family Medicine

## 2015-12-25 DIAGNOSIS — R1011 Right upper quadrant pain: Secondary | ICD-10-CM | POA: Diagnosis present

## 2015-12-25 MED ORDER — SINCALIDE 5 MCG IJ SOLR
0.0200 ug/kg | Freq: Once | INTRAMUSCULAR | Status: AC
  Administered 2015-12-25: 1.7 ug via INTRAVENOUS

## 2015-12-25 MED ORDER — TECHNETIUM TC 99M MEBROFENIN IV KIT
5.0000 | PACK | Freq: Once | INTRAVENOUS | Status: AC | PRN
  Administered 2015-12-25: 4.95 via INTRAVENOUS

## 2015-12-26 ENCOUNTER — Ambulatory Visit: Payer: BLUE CROSS/BLUE SHIELD | Admitting: Family Medicine

## 2016-10-05 ENCOUNTER — Telehealth: Payer: Self-pay | Admitting: Family Medicine

## 2016-10-05 ENCOUNTER — Encounter: Payer: Self-pay | Admitting: Family Medicine

## 2016-10-05 ENCOUNTER — Ambulatory Visit (INDEPENDENT_AMBULATORY_CARE_PROVIDER_SITE_OTHER): Admitting: Family Medicine

## 2016-10-05 DIAGNOSIS — M545 Low back pain, unspecified: Secondary | ICD-10-CM | POA: Insufficient documentation

## 2016-10-05 NOTE — Progress Notes (Signed)
Pre visit review using our clinic review tool, if applicable. No additional management support is needed unless otherwise documented below in the visit note. 

## 2016-10-05 NOTE — Assessment & Plan Note (Signed)
New acute problem. MSK in nature. Excused from PT and PT test. PRN Ibuprofen 800 mg. Rest, heat.

## 2016-10-05 NOTE — Progress Notes (Signed)
   Subjective:  Patient ID: Martin Bryant, male    DOB: [birth year]  Age: 36 y.o. MRN: [id number]  CC: Low back pain  HPI:  36 year old male presents with complaints of low back pain.  Patient reports his back pain started middle of last week. Located in the low back bilaterally. Moderate in severity. He has been lifting more frequently and thinks that this may be the cause. He has recently done some heavy lifting. He has taken some Advil minimally without much improvement. Also been using heat. No associated radicular pain/radiculopathy. No other medications dry. Seems to be exacerbated by activity. No other associated symptoms. No other complaints or concerns at this time.  Social Hx   Social History   Social History  . Marital status: Married    Spouse name: N/A  . Number of children: N/A  . Years of education: N/A   Social History Main Topics  . Smoking status: Never Smoker  . Smokeless tobacco: Never Used  . Alcohol use 0.0 oz/week     Comment: occasional 3 times a week   . Drug use: No  . Sexual activity: Yes   Other Topics Concern  . 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   Musculoskeletal: Positive for back pain.   Objective:  BP 130/85   Pulse 70   Temp 98.7 F (37.1 C) (Oral)   Wt 192 lb 9.6 oz (87.4 kg)   SpO2 96%   BMI 31.09 kg/m   BP/Weight 10/05/2016 12/25/2015 3/57/0177  Systolic BP 939 - 030  Diastolic BP 85 - 82  Wt. (Lbs) 192.6 188 188.4  BMI 31.09 30.36 30.42   Physical Exam  Constitutional: He is oriented to person, place, and time. He appears well-developed. No distress.  Cardiovascular: Normal rate and regular rhythm.   Pulmonary/Chest: Effort  normal and breath sounds normal.  Musculoskeletal:  Low back - paraspinal muscle tenderness. Negative straight leg raise.  Neurological: He is alert and oriented to person, place, and time.  Psychiatric: He has a normal mood and affect.  Vitals reviewed.  Lab Results  Component Value Date   WBC 9.4 12/12/2015   HGB 15.6 12/12/2015   HCT 44.8 12/12/2015   PLT 236 12/12/2015   GLUCOSE 138 (H) 12/12/2015   CHOL 133 05/03/2015   TRIG 71.0 05/03/2015   HDL 33.20 (L) 05/03/2015   LDLCALC 86 05/03/2015   ALT 24 12/12/2015   AST 22 12/12/2015   NA 135 12/12/2015   K 4.0 12/12/2015   CL 106 12/12/2015   CREATININE 0.99 12/12/2015   BUN 23 (H) 12/12/2015   CO2 24 12/12/2015   TSH 1.57 05/03/2015    Assessment & Plan:   Problem List Items Addressed This Visit    Low back pain    New acute problem. MSK in nature. Excused from PT and PT test. PRN Ibuprofen 800 mg. Rest, heat.        Follow-up: PRN  [place]

## 2016-10-05 NOTE — Patient Instructions (Signed)
Ibuprofen 800 mg three times daily as needed.  Rest, heat.  Follow up as needed  Take care  Dr. Adriana Simas  Low Back Strain Rehab Ask your health care provider which exercises are safe for you. Do exercises exactly as told by your health care provider and adjust them as directed. It is normal to feel mild stretching, pulling, tightness, or discomfort as you do these exercises, but you should stop right away if you feel sudden pain or your pain gets worse. Do not begin these exercises until told by your health care provider. Stretching and range of motion exercises These exercises warm up your muscles and joints and improve the movement and flexibility of your back. These exercises also help to relieve pain, numbness, and tingling. Exercise A: Single knee to chest 1. Lie on your back on a firm surface with both legs straight. 2. Bend one of your knees. Use your hands to move your knee up toward your chest until you feel a gentle stretch in your lower back and buttock.  Hold your leg in this position by holding onto the front of your knee.  Keep your other leg as straight as possible. 3. Hold for __________ seconds. 4. Slowly return to the starting position. 5. Repeat with your other leg. Repeat __________ times. Complete this exercise __________ times a day. Exercise B: Prone extension on elbows 1. Lie on your abdomen on a firm surface. 2. Prop yourself up on your elbows. 3. Use your arms to help lift your chest up until you feel a gentle stretch in your abdomen and your lower back.  This will place some of your body weight on your elbows. If this is uncomfortable, try stacking pillows under your chest.  Your hips should stay down, against the surface that you are lying on. Keep your hip and back muscles relaxed. 4. Hold for __________ seconds. 5. Slowly relax your upper body and return to the starting position. Repeat __________ times. Complete this exercise __________ times a  day. Strengthening exercises These exercises build strength and endurance in your back. Endurance is the ability to use your muscles for a long time, even after they get tired. Exercise C: Pelvic tilt 1. Lie on your back on a firm surface. Bend your knees and keep your feet flat. 2. Tense your abdominal muscles. Tip your pelvis up toward the ceiling and flatten your lower back into the floor.  To help with this exercise, you may place a small towel under your lower back and try to push your back into the towel. 3. Hold for __________ seconds. 4. Let your muscles relax completely before you repeat this exercise. Repeat __________ times. Complete this exercise __________ times a day. Exercise D: Alternating arm and leg raises 1. Get on your hands and knees on a firm surface. If you are on a hard floor, you may want to use padding to cushion your knees, such as an exercise mat. 2. Line up your arms and legs. Your hands should be below your shoulders, and your knees should be below your hips. 3. Lift your left leg behind you. At the same time, raise your right arm and straighten it in front of you.  Do not lift your leg higher than your hip.  Do not lift your arm higher than your shoulder.  Keep your abdominal and back muscles tight.  Keep your hips facing the ground.  Do not arch your back.  Keep your balance carefully, and do not hold  your breath. 4. Hold for __________ seconds. 5. Slowly return to the starting position and repeat with your right leg and your left arm. Repeat __________ times. Complete this exercise __________times a day. Exercise J: Single leg lower with bent knees 1. Lie on your back on a firm surface. 2. Tense your abdominal muscles and lift your feet off the floor, one foot at a time, so your knees and hips are bent in an "L" shape (at about 90 degrees).  Your knees should be over your hips and your lower legs should be parallel to the floor. 3. Keeping your  abdominal muscles tense and your knee bent, slowly lower one of your legs so your toe touches the ground. 4. Lift your leg back up to return to the starting position.  Do not hold your breath.  Do not let your back arch. Keep your back flat against the ground. 5. Repeat with your other leg. Repeat __________ times. Complete this exercise __________ times a day. Posture and body mechanics   Body mechanics refers to the movements and positions of your body while you do your daily activities. Posture is part of body mechanics. Good posture and healthy body mechanics can help to relieve stress in your body's tissues and joints. Good posture means that your spine is in its natural S-curve position (your spine is neutral), your shoulders are pulled back slightly, and your head is not tipped forward. The following are general guidelines for applying improved posture and body mechanics to your everyday activities. Standing   When standing, keep your spine neutral and your feet about hip-width apart. Keep a slight bend in your knees. Your ears, shoulders, and hips should line up.  When you do a task in which you stand in one place for a long time, place one foot up on a stable object that is 2-4 inches (5-10 cm) high, such as a footstool. This helps keep your spine neutral. Sitting  When sitting, keep your spine neutral and keep your feet flat on the floor. Use a footrest, if necessary, and keep your thighs parallel to the floor. Avoid rounding your shoulders, and avoid tilting your head forward.  When working at a desk or a computer, keep your desk at a height where your hands are slightly lower than your elbows. Slide your chair under your desk so you are close enough to maintain good posture.  When working at a computer, place your monitor at a height where you are looking straight ahead and you do not have to tilt your head forward or downward to look at the screen. Resting   When lying down  and resting, avoid positions that are most painful for you.  If you have pain with activities such as sitting, bending, stooping, or squatting (flexion-based activities), lie in a position in which your body does not bend very much. For example, avoid curling up on your side with your arms and knees near your chest (fetal position).  If you have pain with activities such as standing for a long time or reaching with your arms (extension-based activities), lie with your spine in a neutral position and bend your knees slightly. Try the following positions:  Lying on your side with a pillow between your knees.  Lying on your back with a pillow under your knees. Lifting   When lifting objects, keep your feet at least shoulder-width apart and tighten your abdominal muscles.  Bend your knees and hips and keep your spine neutral.  It is important to lift using the strength of your legs, not your back. Do not lock your knees straight out.  Always ask for help to lift heavy or awkward objects. This information is not intended to replace advice given to you by your health care provider. Make sure you discuss any questions you have with your health care provider. Document Released: 08/17/2005 Document Revised: 04/23/2016 Document Reviewed: 05/29/2015 Elsevier Interactive Patient Education  2017 [employer].

## 2016-10-05 NOTE — Telephone Encounter (Signed)
pts wife called in stating that pt has been having back pain for the last couple of days. Pt needs to be seen due to hime doing a fit test for the arm forces. Pt has a hx of back problems. Schedule with Dr.Cook at 230 p.m today 10/05/16.

## 2017-04-15 IMAGING — CT CT ABD-PELV W/ CM
1 of 2 series · 15 of 32 positions shown, 19 images · IV contrast (iopamidol)
Comparison: None.

CLINICAL DATA: Acute onset of right upper quadrant abdominal pain
and shortness of breath. Syncope. Initial encounter.

EXAM:
CT ABDOMEN AND PELVIS WITH CONTRAST
TECHNIQUE: Multidetector CT imaging of the abdomen and pelvis was performed
using the standard protocol following bolus administration of
intravenous contrast.
CONTRAST:  100mL [id number] IOPAMIDOL ([id number]) INJECTION 61%

[Series 2: routine abd pel with · axial · 0.77mm/px · z∈[-997,-562]mm · 15 of 97 slices shown, 19 images]
[im 5/97  soft-tissue]
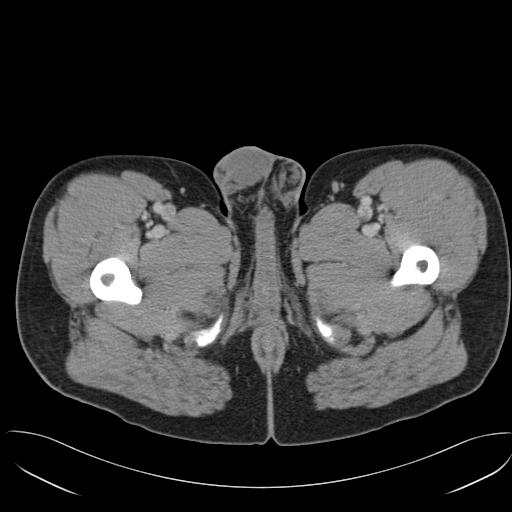
[im 5/97  bone]
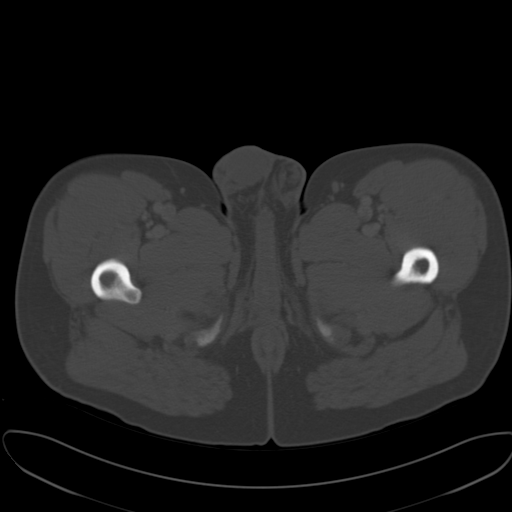
[im 13/97  soft-tissue]
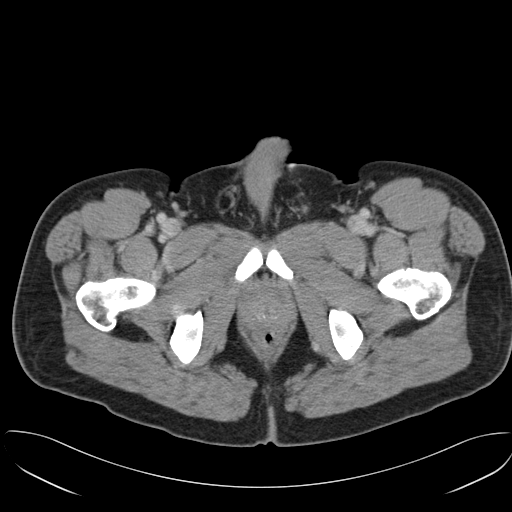
[im 21/97  soft-tissue]
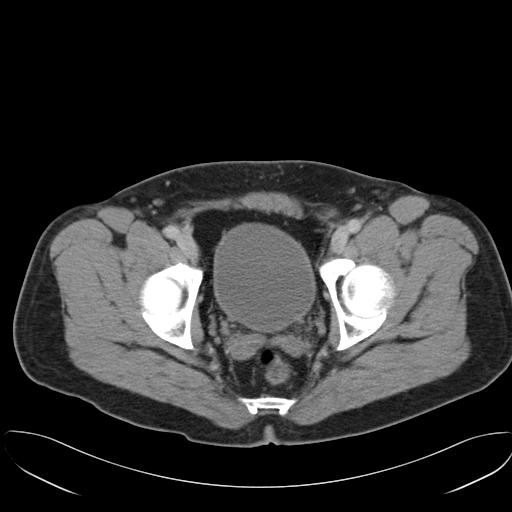
[im 26/97  soft-tissue]
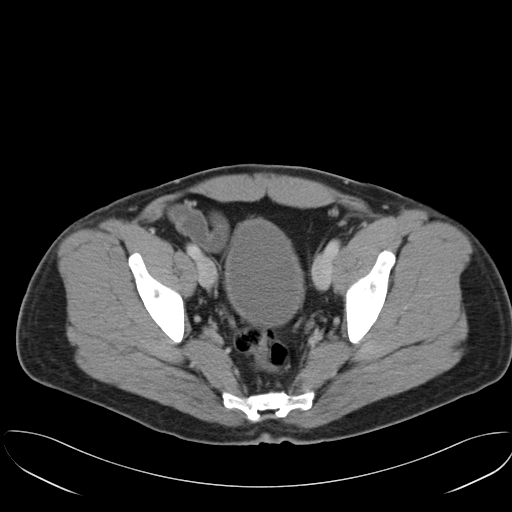
[im 34/97  soft-tissue]
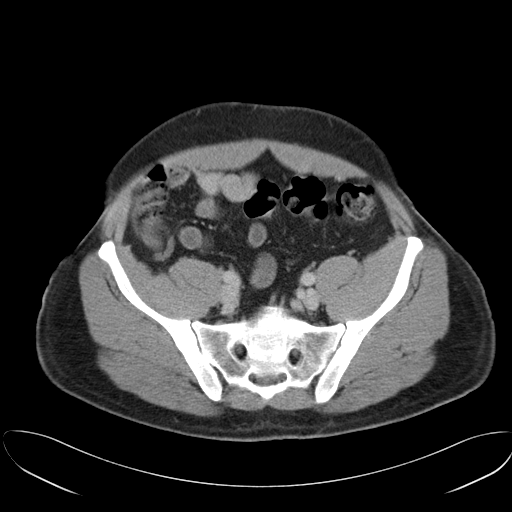
[im 42/97  soft-tissue]
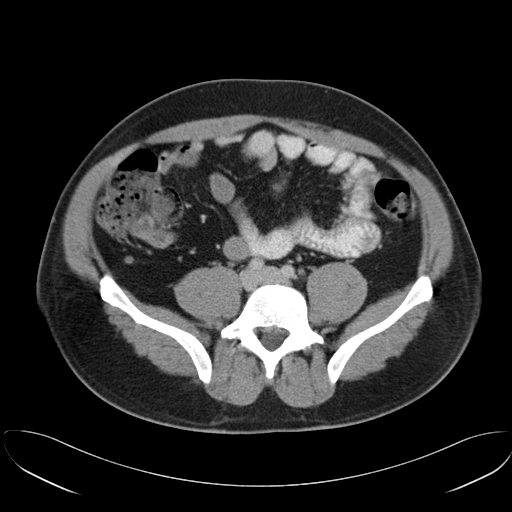
[im 51/97  soft-tissue]
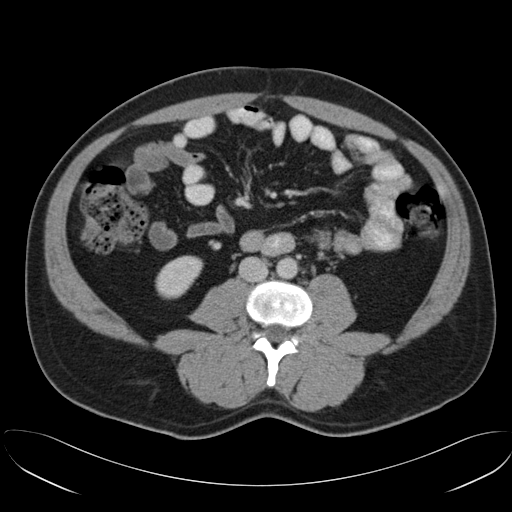
[im 55/97  soft-tissue]
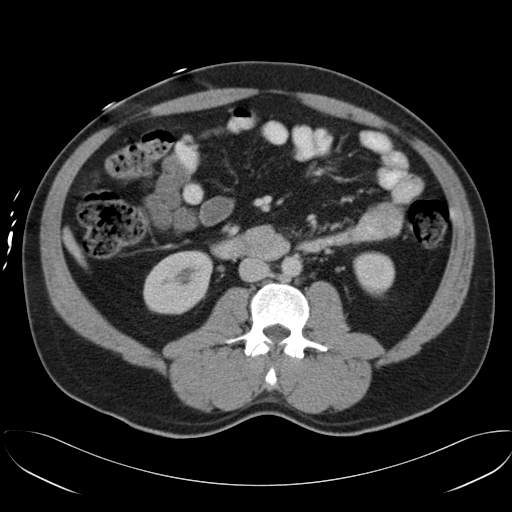
[im 63/97  soft-tissue]
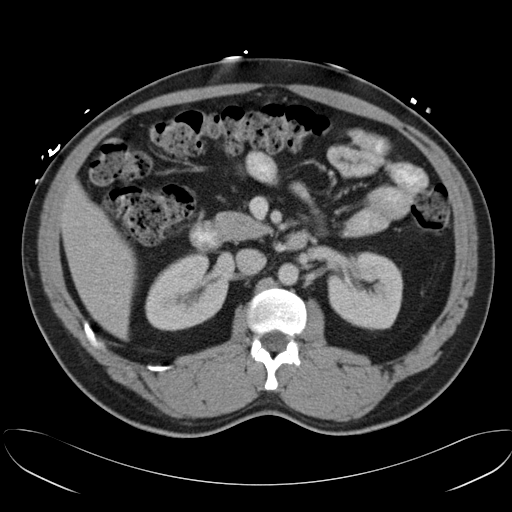
[im 63/97  bone]
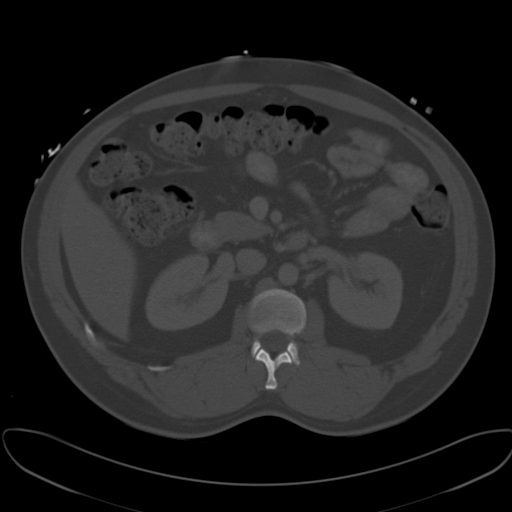
[im 71/97  soft-tissue]
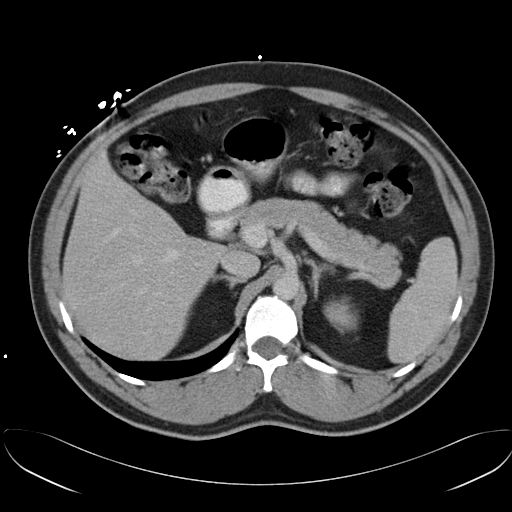
[im 76/97  soft-tissue]
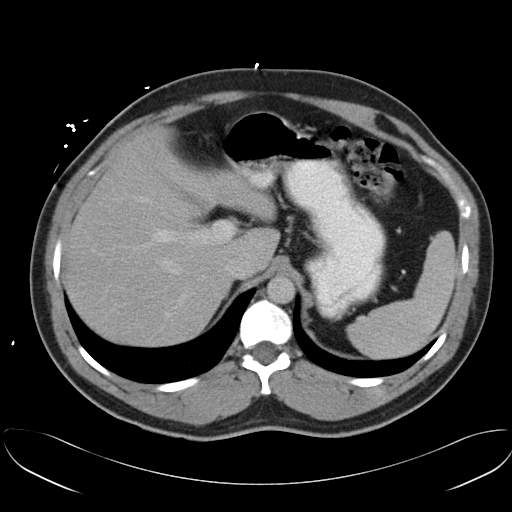
[im 80/97  lung]
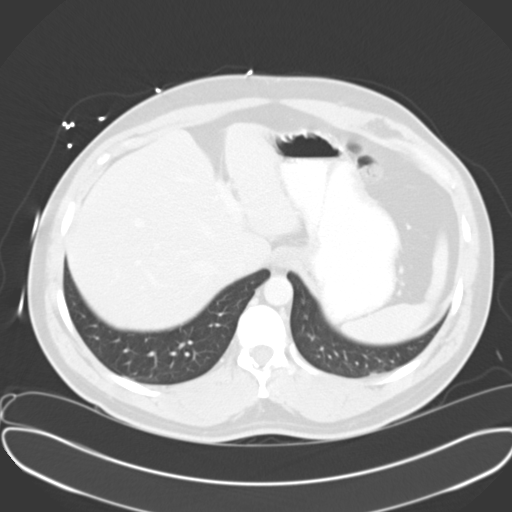
[im 84/97  soft-tissue]
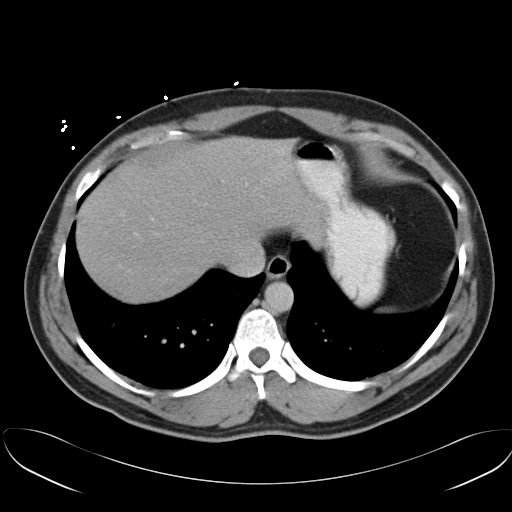
[im 84/97  lung]
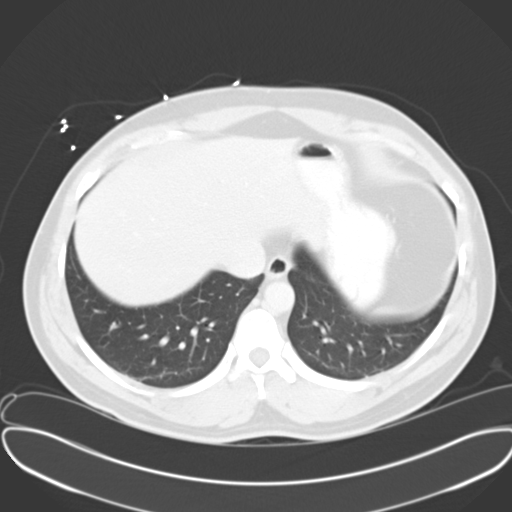
[im 88/97  lung]
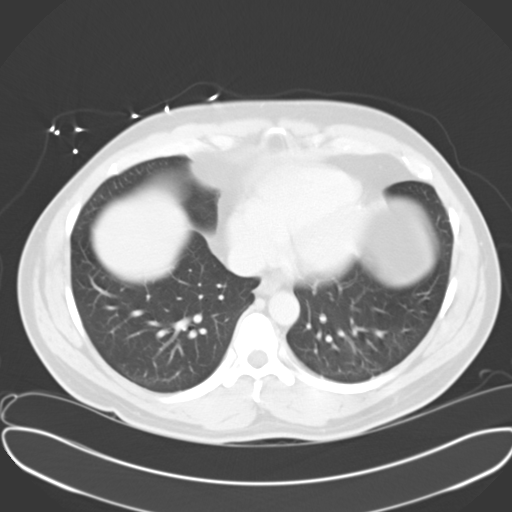
[im 92/97  soft-tissue]
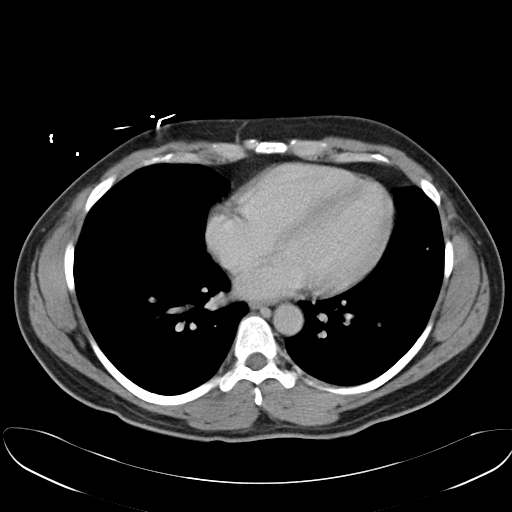
[im 92/97  lung]
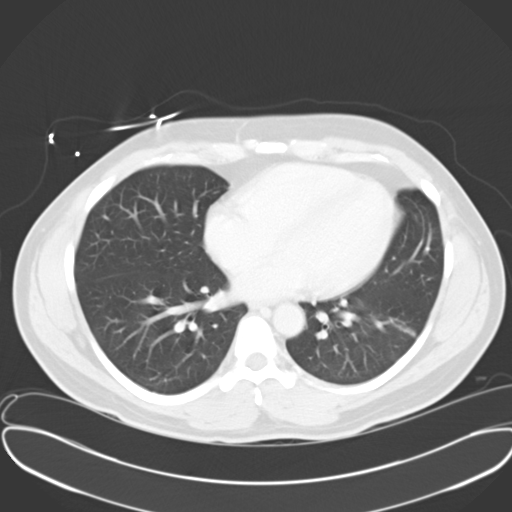

[15 of 32 positions shown; findings below may reference images not displayed]

FINDINGS: The visualized lung bases are clear.

The liver and spleen are unremarkable in appearance. The gallbladder
is within normal limits. The pancreas and adrenal glands are
unremarkable.

The kidneys are unremarkable in appearance. There is no evidence of
hydronephrosis. No renal or ureteral stones are seen. No perinephric
stranding is appreciated.

No free fluid is identified. The small bowel is unremarkable in
appearance. The stomach is within normal limits. No acute vascular
abnormalities are seen.

The appendix is normal in caliber, without evidence of appendicitis.
The colon is unremarkable in appearance.

The bladder is mildly distended and grossly unremarkable. The
prostate remains normal in size, with minimal calcification. No
inguinal lymphadenopathy is seen.

No acute osseous abnormalities are identified.
IMPRESSION: Unremarkable contrast-enhanced CT of the abdomen and pelvis.

## 2017-04-15 IMAGING — CT CT HEAD W/O CM
1 series · 16 of 30 positions shown, 20 images · non-contrast
Comparison: None.

CLINICAL DATA: Acute onset of syncope. Lightheadedness. Initial
encounter.

EXAM:
CT HEAD WITHOUT CONTRAST
TECHNIQUE: Contiguous axial images were obtained from the base of the skull
through the vertex without intravenous contrast.

[Series 2: head wo · axial · 0.43mm/px · z∈[-153,-9]mm · 16 of 36 slices shown, 20 images]
[im 2/36  brain]
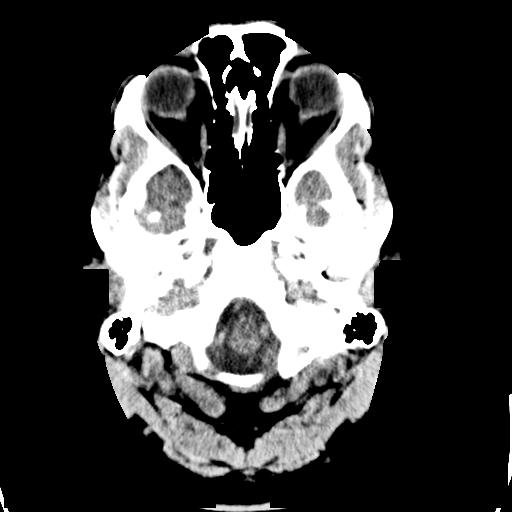
[im 2/36  bone]
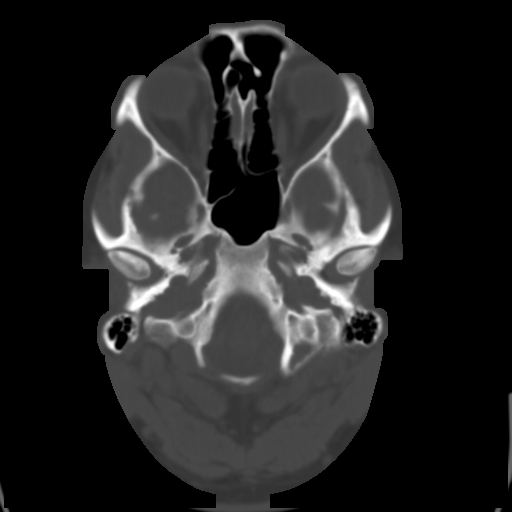
[im 4/36  brain]
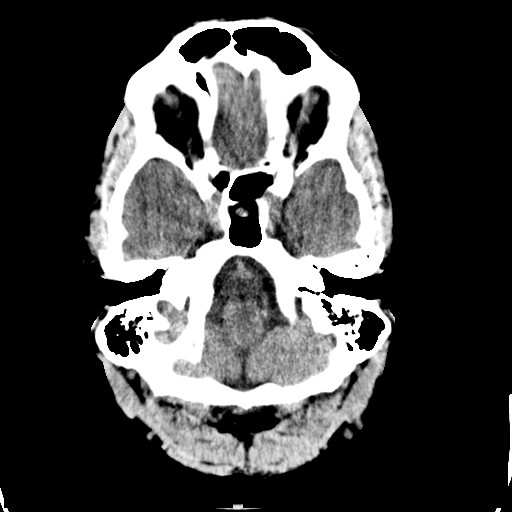
[im 7/36  brain]
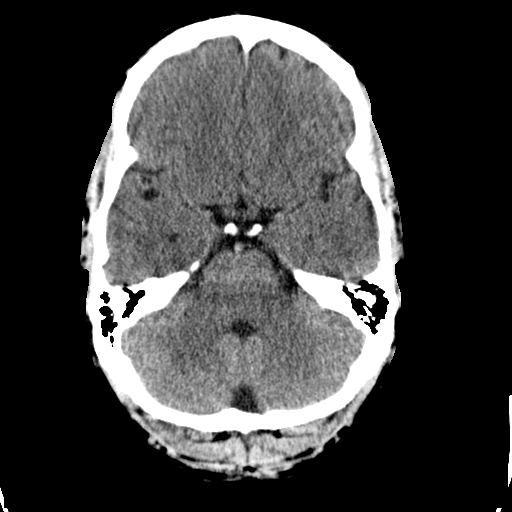
[im 9/36  brain]
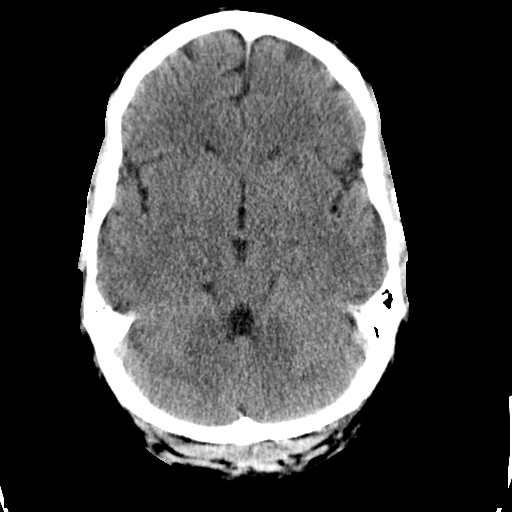
[im 10/36  brain]
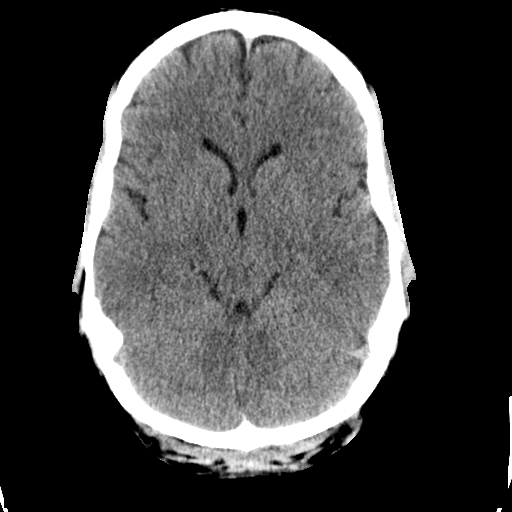
[im 10/36  bone]
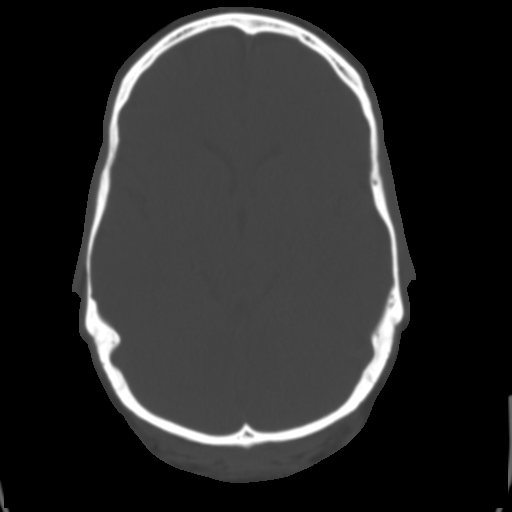
[im 13/36  brain]
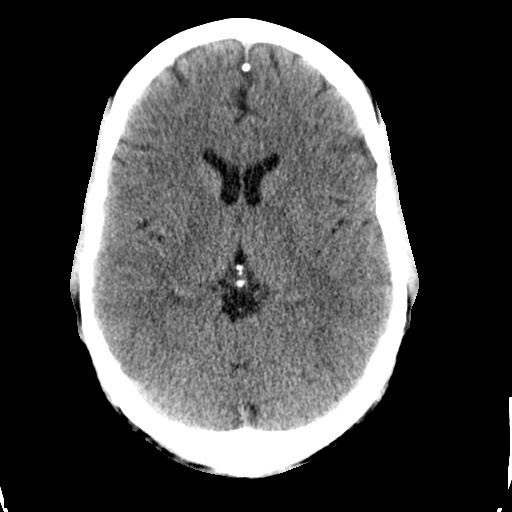
[im 15/36  brain]
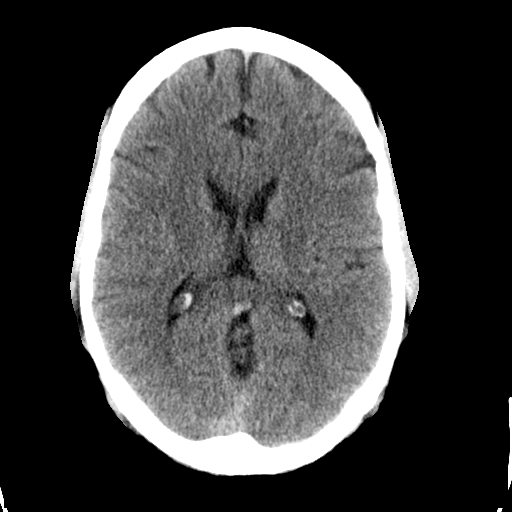
[im 17/36  brain]
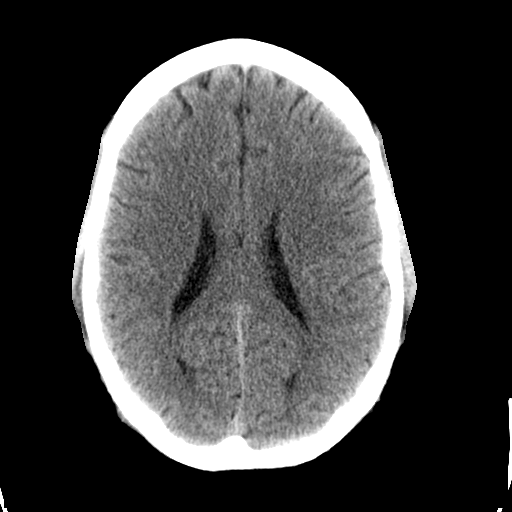
[im 19/36  brain]
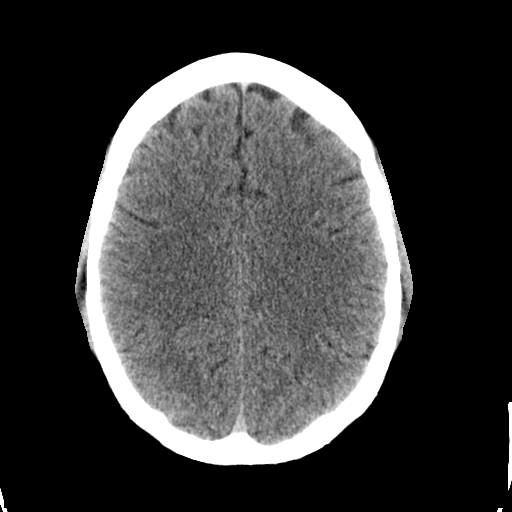
[im 19/36  bone]
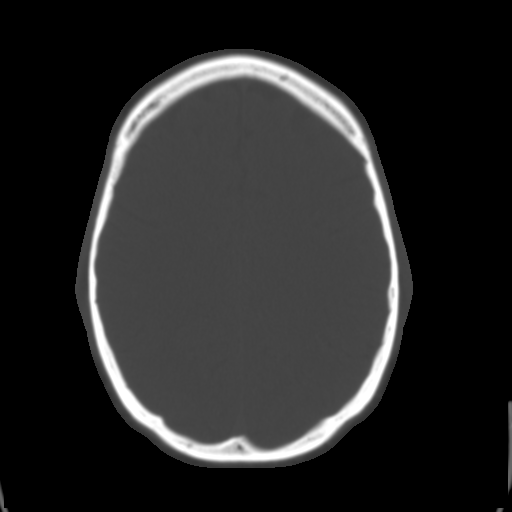
[im 21/36  brain]
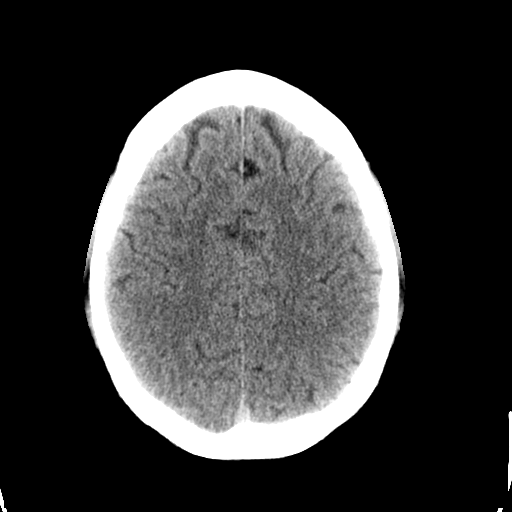
[im 23/36  brain]
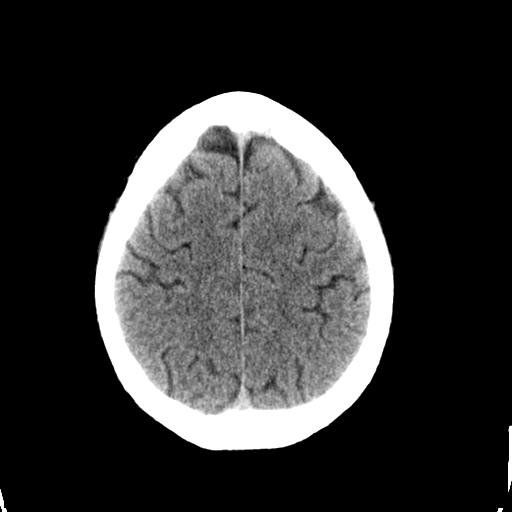
[im 26/36  brain]
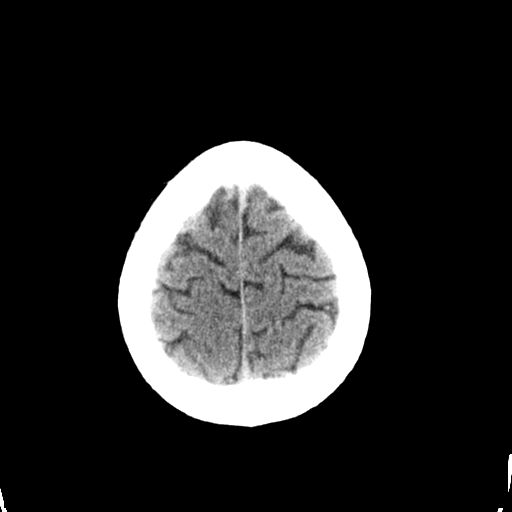
[im 27/36  brain]
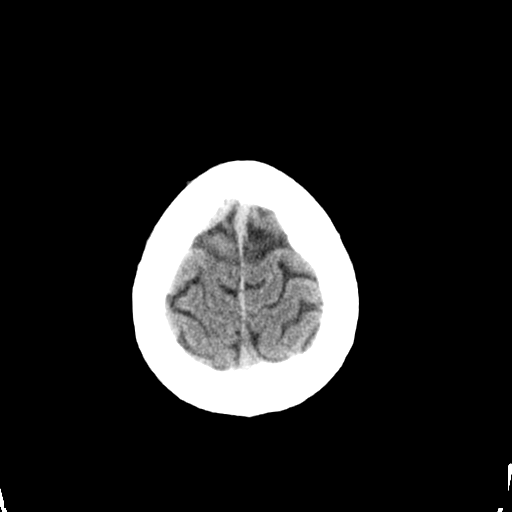
[im 27/36  bone]
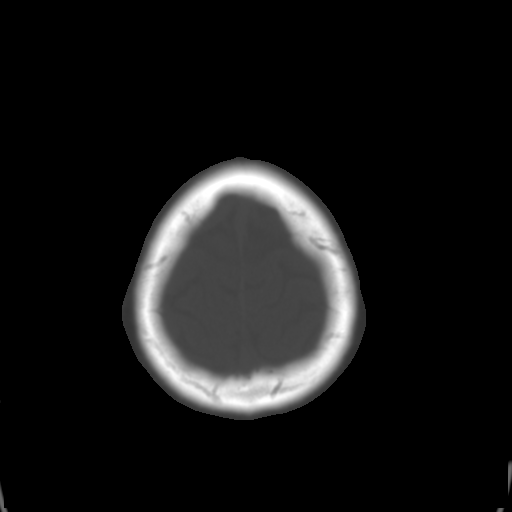
[im 29/36  brain]
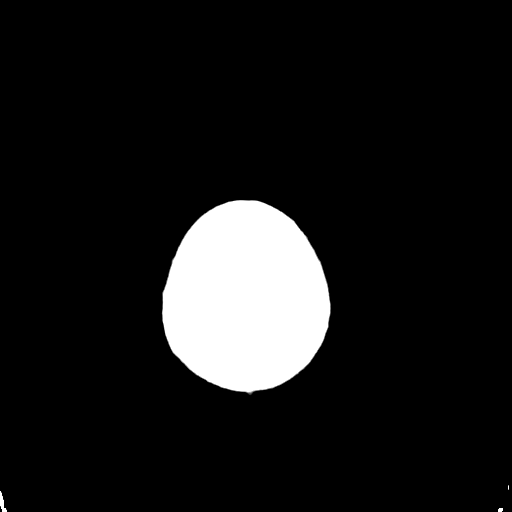
[im 32/36  brain]
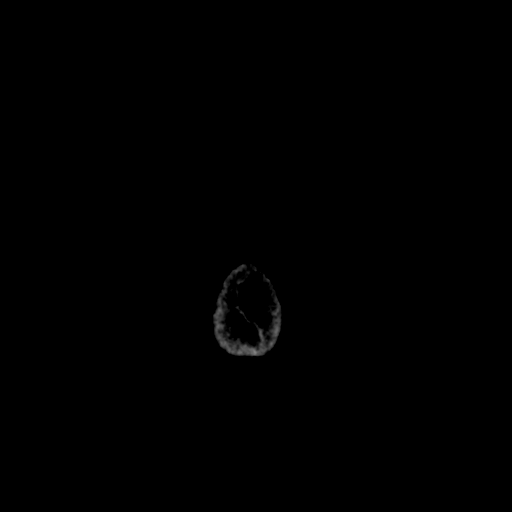
[im 34/36  brain]
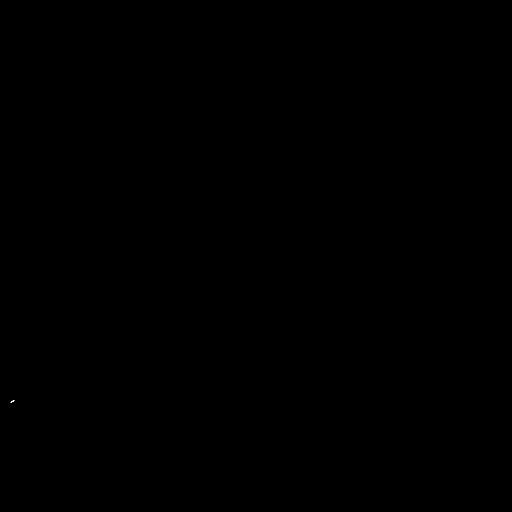

[16 of 30 positions shown; findings below may reference images not displayed]

FINDINGS: There is no evidence of acute infarction, mass lesion, or intra- or
extra-axial hemorrhage on CT.

The posterior fossa, including the cerebellum, brainstem and fourth
ventricle, is within normal limits. The third and lateral
ventricles, and basal ganglia are unremarkable in appearance. The
cerebral hemispheres are symmetric in appearance, with normal
gray-white differentiation. No mass effect or midline shift is seen.

There is no evidence of fracture; visualized osseous structures are
unremarkable in appearance. The visualized portions of the orbits
are within normal limits. The paranasal sinuses and mastoid air
cells are well-aerated. No significant soft tissue abnormalities are
seen.
IMPRESSION: Unremarkable noncontrast CT of the head.
